# Patient Record
Sex: Female | Born: 1995 | Race: Black or African American | Hispanic: No | Marital: Single | Smoking: Current every day smoker
Health system: Southern US, Community
[De-identification: ages and names within clinical notes are randomized; demographics above are authoritative.]

## PROBLEM LIST (undated history)

## (undated) DIAGNOSIS — Z789 Other specified health status: Secondary | ICD-10-CM

## (undated) HISTORY — PX: NO PAST SURGERIES: SHX2092

---
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
## 2014-10-01 ENCOUNTER — Emergency Department: Payer: Self-pay | Admitting: Emergency Medicine

## 2017-03-29 ENCOUNTER — Emergency Department
Admission: EM | Admit: 2017-03-29 | Discharge: 2017-03-29 | Disposition: A | Discharge status: Home / Self Care | Payer: Self-pay | Attending: Emergency Medicine | Admitting: Emergency Medicine

## 2017-03-29 ENCOUNTER — Emergency Department: Payer: Self-pay

## 2017-03-29 ENCOUNTER — Encounter: Payer: Self-pay | Admitting: Emergency Medicine

## 2017-03-29 DIAGNOSIS — S93432A Sprain of tibiofibular ligament of left ankle, initial encounter: Secondary | ICD-10-CM | POA: Insufficient documentation

## 2017-03-29 DIAGNOSIS — Y929 Unspecified place or not applicable: Secondary | ICD-10-CM | POA: Insufficient documentation

## 2017-03-29 DIAGNOSIS — S93492A Sprain of other ligament of left ankle, initial encounter: Secondary | ICD-10-CM

## 2017-03-29 DIAGNOSIS — Y9302 Activity, running: Secondary | ICD-10-CM | POA: Insufficient documentation

## 2017-03-29 DIAGNOSIS — Y990 Civilian activity done for income or pay: Secondary | ICD-10-CM | POA: Insufficient documentation

## 2017-03-29 DIAGNOSIS — W1789XA Other fall from one level to another, initial encounter: Secondary | ICD-10-CM | POA: Insufficient documentation

## 2017-03-29 MED ORDER — IBUPROFEN 600 MG PO TABS: 600.0000 mg | ORAL_TABLET | Freq: Four times a day (QID) | ORAL | 0 refills | Status: DC | PRN

## 2017-03-29 NOTE — ED Provider Notes (Signed)
[facility]-EMERGENCY DEPARTMENT Provider Note   CSN: [id number] Arrival date & time: 03/29/17  2155     History   Chief Complaint Chief Complaint  Patient presents with  . Ankle Pain    HPI Courtney Forbes is a 21 y.o. female presents to the emergency department for evaluation of left ankle pain. 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 She was able to continue walking and ambulating but later on in the day was unable to bear weight on the left ankle. She complains of lateral ankle pain that is increased with weightbearing. She gets relief with staying off of the ankle. She has not had any medications for pain today. Her pain is moderate with weightbearing. Denies any other injury to her body.  HPI  No past medical history on file.  There are no active problems to display for this patient.   No past surgical history on file.  OB History    No data available       Home Medications    Prior to Admission medications   Medication Sig Start Date End Date Taking? Authorizing Provider  ibuprofen (ADVIL,MOTRIN) 600 MG tablet Take 1 tablet (600 mg total) by mouth every 6 (six) hours as needed for moderate pain. 03/29/17   Evon SlackGaines, Cohen Boettner C, PA-C    Family History No family history on file.  Social History Social History  Substance Use Topics  . Smoking status: Not on file  . Smokeless tobacco: Not on file  . Alcohol use Not on file     Allergies   Patient has no known allergies.   Review of Systems Review of Systems  Constitutional: Negative.  Negative for activity change, chills, fatigue and fever.  HENT: Negative for congestion, sinus pressure and sore throat.   Eyes: Negative for visual disturbance.  Respiratory: Negative for cough, chest tightness and shortness of breath.   Cardiovascular: Negative for chest pain and leg swelling.  Gastrointestinal: Negative for  abdominal pain, diarrhea, nausea and vomiting.  Genitourinary: Negative for dysuria.  Musculoskeletal: Positive for gait problem and joint swelling. Negative for arthralgias, back pain and neck pain.  Skin: Negative for color change, rash and wound.  Neurological: Negative for dizziness, syncope, weakness, numbness and headaches.  Hematological: Negative for adenopathy.  Psychiatric/Behavioral: Negative for agitation, behavioral problems, confusion and hallucinations.  All other systems reviewed and are negative.    Physical Exam Updated Vital Signs BP 131/85 (BP Location: Left Arm)   Pulse (!) 108   Temp 97.8 F (36.6 C)   Resp 18   Ht 5\' 7"  (1.702 m)   Wt 104.3 kg   LMP 02/27/2017   SpO2 100%   BMI 36.02 kg/m   Physical Exam  Constitutional: She is oriented to person, place, and time. She appears well-developed and well-nourished. No distress.  HENT:  Head: Normocephalic and atraumatic.  Mouth/Throat: Oropharynx is clear and moist.  Eyes: EOM are normal. Pupils are equal, round, and reactive to light. Right eye exhibits no discharge. Left eye exhibits no discharge.  Neck: Normal range of motion. Neck supple.  Cardiovascular: Normal rate, regular rhythm and intact distal pulses.   Pulmonary/Chest: Effort normal and breath sounds normal. No respiratory distress. She exhibits no tenderness.  Abdominal: Soft. She exhibits no distension. There is no tenderness.  Musculoskeletal: Normal range of motion. She exhibits no edema.  Left ankle: She exhibits swelling. She exhibits no ecchymosis, no deformity, no laceration and normal pulse. Tenderness. Lateral malleolus and AITFL tenderness found. Achilles tendon exhibits no pain, no defect and normal Thompson's test results.  Neurological: She is alert and oriented to person, place, and time. She has normal reflexes.  Skin: Skin is warm and dry.  Psychiatric: She has a normal mood and affect. Her behavior is normal. Judgment and  thought content normal.     ED Treatments / Results  Labs (all labs ordered are listed, but only abnormal results are displayed) Labs Reviewed - No data to display  EKG  EKG Interpretation None       Radiology Dg Ankle Complete Left  Result Date: 03/29/2017 CLINICAL DATA:  Twisting injury to left ankle while walking, with left ankle pain. Initial encounter. EXAM: LEFT ANKLE COMPLETE - 3+ VIEW COMPARISON:  None. FINDINGS: There is no evidence of fracture or dislocation. The ankle mortise is intact; the interosseous space is within normal limits. No talar tilt or subluxation is seen. A plantar calcaneal spur is noted. The joint spaces are preserved. No significant soft tissue abnormalities are seen. IMPRESSION: No evidence of fracture or dislocation. Electronically Signed   By: Roanna Raider M.D.   On: 03/29/2017 22:19    Procedures Procedures (including critical care time) SPLINT APPLICATION Date/Time: 11:13 PM Authorized by: Patience Musca Consent: Verbal consent obtained. Risks and benefits: risks, benefits and alternatives were discussed Consent given by: patient Splint applied by: ED tech Location details: Left ankle  Splint type: Prefabricated ankle stirrup splint  Supplies used: Prefabricated ankle stirrup splint, crutches Post-procedure: The splinted body part was neurovascularly unchanged following the procedure. Patient tolerance: Patient tolerated the procedure well with no immediate complications.     Medications Ordered in ED Medications - No data to display   Initial Impression / Assessment and Plan / ED Course  I have reviewed the triage vital signs and the nursing notes.  Pertinent labs & imaging results that were available during my care of the patient were reviewed by me and considered in my medical decision making (see chart for details).     21 year old female with left lateral ankle sprain. She will rest ice and elevate. She is given  crutches to help with ambulation. I bupropion as needed for pain. She will follow-up with orthopedics in 5-7 days if no improvement.  Final Clinical Impressions(s) / ED Diagnoses   Final diagnoses:  Sprain of anterior talofibular ligament of left ankle, initial encounter    New Prescriptions New Prescriptions   IBUPROFEN (ADVIL,MOTRIN) 600 MG TABLET    Take 1 tablet (600 mg total) by mouth every 6 (six) hours as needed for moderate pain.     Evon Slack, PA-C 03/29/17 2313    Emily Filbert, MD 03/30/17 1501

## 2017-03-29 NOTE — Discharge Instructions (Signed)
Please rest ice and elevate the left ankle. Use crutches as needed for ambulation. Ibuprofen as needed for pain. Follow-up with orthopedics if no improvement in 5-7 days.

## 2017-03-29 NOTE — ED Notes (Signed)

## 2017-03-29 NOTE — ED Triage Notes (Signed)
Pt twisted left ankle today, co pain.

## 2017-12-05 ENCOUNTER — Encounter: Payer: Self-pay | Admitting: Emergency Medicine

## 2017-12-05 ENCOUNTER — Emergency Department
Admission: EM | Admit: 2017-12-05 | Discharge: 2017-12-05 | Disposition: A | Discharge status: Home / Self Care | Payer: Self-pay | Attending: Emergency Medicine | Admitting: Emergency Medicine

## 2017-12-05 DIAGNOSIS — R112 Nausea with vomiting, unspecified: Secondary | ICD-10-CM | POA: Insufficient documentation

## 2017-12-05 DIAGNOSIS — R109 Unspecified abdominal pain: Secondary | ICD-10-CM | POA: Insufficient documentation

## 2017-12-05 DIAGNOSIS — R197 Diarrhea, unspecified: Secondary | ICD-10-CM | POA: Insufficient documentation

## 2017-12-05 LAB — CBC
HCT: 36.8 % (ref 35.0–47.0)
HEMOGLOBIN: 12.2 g/dL (ref 12.0–16.0)
MCH: 26.8 pg (ref 26.0–34.0)
MCHC: 33.2 g/dL (ref 32.0–36.0)
MCV: 80.8 fL (ref 80.0–100.0)
Platelets: 261 10*3/uL (ref 150–440)
RBC: 4.56 MIL/uL (ref 3.80–5.20)
RDW: 16.3 % — ABNORMAL HIGH (ref 11.5–14.5)
WBC: 4.8 10*3/uL (ref 3.6–11.0)

## 2017-12-05 LAB — URINALYSIS, COMPLETE (UACMP) WITH MICROSCOPIC
BILIRUBIN URINE: NEGATIVE
Bacteria, UA: NONE SEEN
GLUCOSE, UA: NEGATIVE mg/dL
Hgb urine dipstick: NEGATIVE
Ketones, ur: NEGATIVE mg/dL
LEUKOCYTES UA: NEGATIVE
Nitrite: NEGATIVE
PROTEIN: NEGATIVE mg/dL
Specific Gravity, Urine: 1.029 (ref 1.005–1.030)
pH: 5 (ref 5.0–8.0)

## 2017-12-05 LAB — COMPREHENSIVE METABOLIC PANEL
ALT: 28 U/L (ref 14–54)
ANION GAP: 7 (ref 5–15)
AST: 25 U/L (ref 15–41)
Albumin: 3.9 g/dL (ref 3.5–5.0)
Alkaline Phosphatase: 64 U/L (ref 38–126)
BUN: 17 mg/dL (ref 6–20)
CALCIUM: 9.1 mg/dL (ref 8.9–10.3)
CHLORIDE: 107 mmol/L (ref 101–111)
CO2: 22 mmol/L (ref 22–32)
Creatinine, Ser: 0.71 mg/dL (ref 0.44–1.00)
GFR calc non Af Amer: >60 mL/min (ref >60)
Glucose, Bld: 83 mg/dL (ref 65–99)
Potassium: 3.8 mmol/L (ref 3.5–5.1)
SODIUM: 136 mmol/L (ref 135–145)
Total Bilirubin: 0.6 mg/dL (ref 0.3–1.2)
Total Protein: 7.9 g/dL (ref 6.5–8.1)

## 2017-12-05 LAB — LIPASE, BLOOD: LIPASE: 28 U/L (ref 11–51)

## 2017-12-05 LAB — POCT PREGNANCY, URINE: Preg Test, Ur: NEGATIVE

## 2017-12-05 MED ORDER — ONDANSETRON HCL 4 MG PO TABS: 4.0000 mg | ORAL_TABLET | Freq: Every day | ORAL | 0 refills | Status: DC | PRN

## 2017-12-05 MED ORDER — DICYCLOMINE HCL 20 MG PO TABS: 20.0000 mg | ORAL_TABLET | Freq: Three times a day (TID) | ORAL | 0 refills | Status: DC | PRN

## 2017-12-05 NOTE — ED Triage Notes (Signed)
Pt reports for the last 3 days has been experiencing abdominal pain and diarrhea. Pt states, "I think my stomach pain is causing the diarrhea". Pt reports her stools have been "loose and hard to come out". Pt denies NV, fevers, dysuria or other symptoms. Pt reports pain is mid lower abdomen.

## 2017-12-05 NOTE — ED Provider Notes (Signed)
[facility], [facility] Emergency Department Provider Note  ____________________________________________   First MD Initiated Contact with Patient 12/05/17 1401     (approximate)  I have reviewed the triage vital signs and the nursing notes.   HISTORY  Chief Complaint Abdominal Pain and Diarrhea   HPI Courtney Forbes is a 22 y.o. female with a history of seasonal allergies who is presenting to the emergency department with 3 days of diarrhea, nausea as well as abdominal cramping.  She says that she has up to 6 episodes of diarrhea per day.  She denies any blood in the diarrhea.  Also one episode of vomiting this morning.  Last episode of vomiting or diarrhea 10 AM this morning.  Denies any abdominal pain at this time.  Says the abdominal pain is lower and of a cramping type and usually happens around the time where she needs to move her bowels.  She denies any recent out of country travel, hospital admissions or antibiotics.  She says that she is a known exposure to her roommate who had a similar illness recently where she was having diarrhea, nausea and vomiting.  Patient says that she continues to feel nauseous.  History reviewed. No pertinent past medical history.  There are no active problems to display for this patient.   History reviewed. No pertinent surgical history.  Prior to Admission medications   Medication Sig Start Date End Date Taking? Authorizing Provider  ibuprofen (ADVIL,MOTRIN) 600 MG tablet Take 1 tablet (600 mg total) by mouth every 6 (six) hours as needed for moderate pain. 03/29/17   Evon SlackGaines, Thomas C, PA-C    Allergies Patient has no known allergies.  No family history on file.  Social History Social History   Tobacco Use  . Smoking status: Not on file  Substance Use Topics  . Alcohol use: Not on file  . Drug use: Not on file    Review of Systems  Constitutional: No fever/chills Eyes: No visual changes. ENT: No sore  throat. Cardiovascular: Denies chest pain. Respiratory: Denies shortness of breath. Gastrointestinal:  No diarrhea.  No constipation. Genitourinary: Negative for dysuria. Musculoskeletal: Negative for back pain. Skin: Negative for rash. Neurological: Negative for headaches, focal weakness or numbness.   ____________________________________________   PHYSICAL EXAM:  VITAL SIGNS: ED Triage Vitals  Enc Vitals Group     BP 12/05/17 1043 (!) 120/103     Pulse Rate 12/05/17 1043 82     Resp 12/05/17 1043 18     Temp 12/05/17 1043 97.9 F (36.6 C)     Temp Source 12/05/17 1043 Oral     SpO2 12/05/17 1043 100 %     Weight 12/05/17 1110 250 lb (113.4 kg)     Height 12/05/17 1110 5\' 6"  (1.676 m)     Head Circumference --      Peak Flow --      Pain Score --      Pain Loc --      Pain Edu? --      Excl. in GC? --     Constitutional: Alert and oriented. Well appearing and in no acute distress. Eyes: Conjunctivae are normal.  Head: Atraumatic. Nose: No congestion/rhinnorhea. Mouth/Throat: Mucous membranes are moist.  Neck: No stridor.   Cardiovascular: Normal rate, regular rhythm. Grossly normal heart sounds.  Respiratory: Normal respiratory effort.  No retractions. Lungs CTAB. Gastrointestinal: Soft and nontender. No distention.  Musculoskeletal: No lower extremity tenderness nor edema.  No joint effusions. Neurologic:  Normal speech and language. No gross focal neurologic deficits are appreciated. Skin:  Skin is warm, dry and intact. No rash noted. Psychiatric: Mood and affect are normal. Speech and behavior are normal.  ____________________________________________   LABS (all labs ordered are listed, but only abnormal results are displayed)  Labs Reviewed  CBC - Abnormal; Notable for the following components:      Result Value   RDW 16.3 (*)    All other components within normal limits  URINALYSIS, COMPLETE (UACMP) WITH MICROSCOPIC - Abnormal; Notable for the  following components:   Color, Urine YELLOW (*)    APPearance HAZY (*)    Squamous Epithelial / LPF 0-5 (*)    All other components within normal limits  LIPASE, BLOOD  COMPREHENSIVE METABOLIC PANEL  POC URINE PREG, ED  POCT PREGNANCY, URINE   ____________________________________________  EKG   ____________________________________________  RADIOLOGY   ____________________________________________   PROCEDURES  Procedure(s) performed:   Procedures  Critical Care performed:   ____________________________________________   INITIAL IMPRESSION / ASSESSMENT AND PLAN / ED COURSE  Pertinent labs & imaging results that were available during my care of the patient were reviewed by me and considered in my medical decision making (see chart for details).  Differential diagnosis includes, but is not limited to, ovarian cyst, ovarian torsion, acute appendicitis, diverticulitis, urinary tract infection/pyelonephritis, endometriosis, bowel obstruction, colitis, renal colic, gastroenteritis, hernia, fibroids, endometriosis, pregnancy related pain including ectopic pregnancy, etc.  Differential diagnosis includes, but is not limited to, biliary disease (biliary colic, acute cholecystitis, cholangitis, choledocholithiasis, etc), intrathoracic causes for epigastric abdominal pain including ACS, gastritis, duodenitis, pancreatitis, small bowel or large bowel obstruction, abdominal aortic aneurysm, hernia, and gastritis. As part of my medical decision making, I reviewed the following data within the electronic MEDICAL RECORD NUMBER Notes from prior ED visits  Patient likely with viral illness.  Very benign exam as well as lab results.  I will discharge the patient with Zofran and Bentyl and advised her to drink plenty of fluids to stay hydrated.  I will give her primary care follow-up.  To return for any worsening or concerning symptoms.  Do not suspect severe intra-abdominal pathology that would  require surgical intervention due to the very benign abdominal exam as well as a normal white blood cell count and benign appearance.  Patient understanding of the plan and willing to comply.      ____________________________________________   FINAL CLINICAL IMPRESSION(S) / ED DIAGNOSES  Abdominal pain with nausea vomiting and diarrhea.    NEW MEDICATIONS STARTED DURING THIS VISIT:  New Prescriptions   No medications on file     Note:  This document was prepared using Dragon voice recognition software and may include unintentional dictation errors.     Myrna Blazer, MD 12/05/17 [id number]

## 2017-12-05 NOTE — ED Notes (Signed)
Unable to access E-Sig pad in room. Discharge instructions printed, signed and sent to HIM for scan into the record.

## 2017-12-05 NOTE — ED Notes (Signed)
Pt verbalizes understanding of d/c instructions, medications and follow up 

## 2018-01-25 ENCOUNTER — Other Ambulatory Visit: Payer: Self-pay

## 2018-01-25 ENCOUNTER — Emergency Department
Admission: EM | Admit: 2018-01-25 | Discharge: 2018-01-25 | Disposition: A | Discharge status: Home / Self Care | Payer: Self-pay | Attending: Emergency Medicine | Admitting: Emergency Medicine

## 2018-01-25 DIAGNOSIS — J40 Bronchitis, not specified as acute or chronic: Secondary | ICD-10-CM | POA: Insufficient documentation

## 2018-01-25 DIAGNOSIS — F17210 Nicotine dependence, cigarettes, uncomplicated: Secondary | ICD-10-CM | POA: Insufficient documentation

## 2018-01-25 MED ORDER — ALBUTEROL SULFATE HFA 108 (90 BASE) MCG/ACT IN AERS: 2.0000 | INHALATION_SPRAY | Freq: Four times a day (QID) | RESPIRATORY_TRACT | 0 refills | Status: AC | PRN

## 2018-01-25 MED ORDER — AZITHROMYCIN 250 MG PO TABS: ORAL_TABLET | ORAL | 0 refills | Status: DC

## 2018-01-25 MED ORDER — PREDNISONE 10 MG PO TABS: ORAL_TABLET | ORAL | 0 refills | Status: DC

## 2018-01-25 NOTE — ED Provider Notes (Signed)
[facility] Emergency Department Provider Note  ____________________________________________  Time seen: Approximately 11:07 AM  I have reviewed the triage vital signs and the nursing notes.   HISTORY  Chief Complaint Cough    HPI Courtney Forbes is a 22 y.o. female that presents to the emergency department for evaluation of productive cough with clear sputum for 1 week.  Patient states that she had a couple episodes of vomiting and diarrhea 5-6 days ago but these have resolved.  She had a fever of 102 earlier in the week.  She works at a daycare.  She smokes a pack of cigarettes per week.  No nasal congestion, shortness of breath, abdominal pain.   History reviewed. No pertinent past medical history.  There are no active problems to display for this patient.   History reviewed. No pertinent surgical history.  Prior to Admission medications   Medication Sig Start Date End Date Taking? Authorizing Provider  albuterol (PROVENTIL HFA;VENTOLIN HFA) 108 (90 Base) MCG/ACT inhaler Inhale 2 puffs into the lungs every 6 (six) hours as needed for wheezing or shortness of breath. 01/25/18   Enid Derry, PA-C  azithromycin (ZITHROMAX Z-PAK) 250 MG tablet Take 2 tablets (500 mg) on  Day 1,  followed by 1 tablet (250 mg) once daily on Days 2 through 5. 01/25/18   Enid Derry, PA-C  dicyclomine (BENTYL) 20 MG tablet Take 1 tablet (20 mg total) by mouth 3 (three) times daily as needed for spasms. 12/05/17 12/05/18  Myrna Blazer, MD  ibuprofen (ADVIL,MOTRIN) 600 MG tablet Take 1 tablet (600 mg total) by mouth every 6 (six) hours as needed for moderate pain. 03/29/17   Evon Slack, PA-C  ondansetron (ZOFRAN) 4 MG tablet Take 1 tablet (4 mg total) by mouth daily as needed. 12/05/17   Schaevitz, Myra Rude, MD  predniSONE (DELTASONE) 10 MG tablet Take 6 tablets on day 1, take 5 tablets on day 2, take 4 tablets on day 3, take 3 tablets on day 4, take 2 tablets on  day 5, take 1 tablet on day 6 01/25/18   Enid Derry, PA-C    Allergies Patient has no known allergies.  No family history on file.  Social History Social History   Tobacco Use  . Smoking status: Never Smoker  Substance Use Topics  . Alcohol use: No    Frequency: Never  . Drug use: Not on file     Review of Systems  Eyes: No visual changes. No discharge. ENT: Negative for congestion and rhinorrhea. Cardiovascular: No chest pain. Respiratory: Positive for cough. No SOB. Gastrointestinal: No abdominal pain.  No nausea.  No constipation. Musculoskeletal: Negative for musculoskeletal pain. Skin: Negative for rash, abrasions, lacerations, ecchymosis. Neurological: Negative for headaches.   ____________________________________________   PHYSICAL EXAM:  VITAL SIGNS: ED Triage Vitals [01/25/18 0957]  Enc Vitals Group     BP 102/77     Pulse Rate 99     Resp 18     Temp 98 F (36.7 C)     Temp Source Oral     SpO2 100 %     Weight 250 lb (113.4 kg)     Height      Head Circumference      Peak Flow      Pain Score 0     Pain Loc      Pain Edu?      Excl. in GC?      Constitutional: Alert and oriented. Well  appearing and in no acute distress. Eyes: Conjunctivae are normal. PERRL. EOMI. No discharge. Head: Atraumatic. ENT: No frontal and maxillary sinus tenderness.      Ears: Tympanic membranes pearly gray with good landmarks. No discharge.      Nose: No congestion/rhinnorhea.      Mouth/Throat: Mucous membranes are moist. Oropharynx non-erythematous. Tonsils not enlarged. No exudates. Uvula midline. Neck: No stridor.   Hematological/Lymphatic/Immunilogical: No cervical lymphadenopathy. Cardiovascular: Normal rate, regular rhythm.  Good peripheral circulation. Respiratory: Normal respiratory effort without tachypnea or retractions. Lungs CTAB. Good air entry to the bases with no decreased or absent breath sounds. Gastrointestinal: Bowel sounds 4 quadrants.  Soft and nontender to palpation. No guarding or rigidity. No palpable masses. No distention. Musculoskeletal: Full range of motion to all extremities. No gross deformities appreciated. Neurologic:  Normal speech and language. No gross focal neurologic deficits are appreciated.  Skin:  Skin is warm, dry and intact. No rash noted.   ____________________________________________   LABS (all labs ordered are listed, but only abnormal results are displayed)  Labs Reviewed - No data to display ____________________________________________  EKG   ____________________________________________  RADIOLOGY   No results found.  ____________________________________________    PROCEDURES  Procedure(s) performed:    Procedures    Medications - No data to display   ____________________________________________   INITIAL IMPRESSION / ASSESSMENT AND PLAN / ED COURSE  Pertinent labs & imaging results that were available during my care of the patient were reviewed by me and considered in my medical decision making (see chart for details).  Review of the [place] CSRS was performed in accordance of the [facility] prior to dispensing any controlled drugs.     Patient's diagnosis is consistent with bronchitis. Vital signs and exam are reassuring. Patient appears well and is staying well hydrated. Patient feels comfortable going home. Patient will be discharged home with prescriptions for azithromycin, prednisone, albuterol inhaler. Patient is to follow up with PCP as needed or otherwise directed. Patient is given ED precautions to return to the ED for any worsening or new symptoms.     ____________________________________________  FINAL CLINICAL IMPRESSION(S) / ED DIAGNOSES  Final diagnoses:  Bronchitis      NEW MEDICATIONS STARTED DURING THIS VISIT:  ED Discharge Orders        Ordered    azithromycin (ZITHROMAX Z-PAK) 250 MG tablet     01/25/18 1128    albuterol (PROVENTIL  HFA;VENTOLIN HFA) 108 (90 Base) MCG/ACT inhaler  Every 6 hours PRN     01/25/18 1128    predniSONE (DELTASONE) 10 MG tablet     01/25/18 1128          This chart was dictated using voice recognition software/Dragon. Despite best efforts to proofread, errors can occur which can change the meaning. Any change was purely unintentional.    Enid DerryWagner, Jame Seelig, PA-C 01/25/18 1608    Phineas SemenGoodman, Graydon, MD 01/26/18 [id number]

## 2018-01-25 NOTE — ED Triage Notes (Signed)
Chest congestion and cough X 3 days, yellow production. Pt alert and oriented X4, active, cooperative, pt in NAD. RR even and unlabored, color WNL.

## 2018-05-14 ENCOUNTER — Emergency Department
Admission: EM | Admit: 2018-05-14 | Discharge: 2018-05-14 | Disposition: A | Discharge status: Home / Self Care | Payer: Self-pay | Attending: Emergency Medicine | Admitting: Emergency Medicine

## 2018-05-14 ENCOUNTER — Encounter: Payer: Self-pay | Admitting: Emergency Medicine

## 2018-05-14 DIAGNOSIS — H1032 Unspecified acute conjunctivitis, left eye: Secondary | ICD-10-CM | POA: Insufficient documentation

## 2018-05-14 MED ORDER — POLYMYXIN B-TRIMETHOPRIM 10000-0.1 UNIT/ML-% OP SOLN: 2.0000 [drp] | Freq: Four times a day (QID) | OPHTHALMIC | 0 refills | Status: DC

## 2018-05-14 MED ORDER — POLYMYXIN B-TRIMETHOPRIM 10000-0.1 UNIT/ML-% OP SOLN
1.0000 [drp] | OPHTHALMIC | Status: DC
  Administered 2018-05-14: 1 [drp] via OPHTHALMIC
  Filled 2018-05-14: qty 10

## 2018-05-14 MED ORDER — AZELASTINE HCL 0.05 % OP SOLN: 1.0000 [drp] | Freq: Two times a day (BID) | OPHTHALMIC | 1 refills | Status: DC

## 2018-05-14 NOTE — ED Provider Notes (Signed)
[facility] Emergency Department Provider Note  ____________________________________________  Time seen: Approximately 10:18 PM  I have reviewed the triage vital signs and the nursing notes.   HISTORY  Chief Complaint Eye Pain    HPI Courtney Forbes is a 22 y.o. female who presents the emergency department complaining of left eye redness, itching, drainage.  Patient reports that yesterday she started to have "itching" to the left eye.  She was rubbing at it and it became "inflamed."  Patient reports that today she has had some yellow/green drainage from the left eye.  Patient does work around [employer] but she states that none of her children have been diagnosed with pinkeye.  Patient reports that symptoms are most consistent with "itching".  She does not wear glasses or contacts.  No trauma to the eye.  No other complaints at this time.    History reviewed. No pertinent past medical history.  There are no active problems to display for this patient.   History reviewed. No pertinent surgical history.  Prior to Admission medications   Medication Sig Start Date End Date Taking? Authorizing Provider  albuterol (PROVENTIL HFA;VENTOLIN HFA) 108 (90 Base) MCG/ACT inhaler Inhale 2 puffs into the lungs every 6 (six) hours as needed for wheezing or shortness of breath. 01/25/18   Enid Derry, PA-C  azelastine (OPTIVAR) 0.05 % ophthalmic solution Place 1 drop into the left eye 2 (two) times daily. 05/14/18   Maley Venezia, Delorise Royals, PA-C  azithromycin (ZITHROMAX Z-PAK) 250 MG tablet Take 2 tablets (500 mg) on  Day 1,  followed by 1 tablet (250 mg) once daily on Days 2 through 5. 01/25/18   Enid Derry, PA-C  dicyclomine (BENTYL) 20 MG tablet Take 1 tablet (20 mg total) by mouth 3 (three) times daily as needed for spasms. 12/05/17 12/05/18  Myrna Blazer, MD  ibuprofen (ADVIL,MOTRIN) 600 MG tablet Take 1 tablet (600 mg total) by mouth every 6 (six) hours as needed for  moderate pain. 03/29/17   Evon Slack, PA-C  ondansetron (ZOFRAN) 4 MG tablet Take 1 tablet (4 mg total) by mouth daily as needed. 12/05/17   Schaevitz, Myra Rude, MD  predniSONE (DELTASONE) 10 MG tablet Take 6 tablets on day 1, take 5 tablets on day 2, take 4 tablets on day 3, take 3 tablets on day 4, take 2 tablets on day 5, take 1 tablet on day 6 01/25/18   Enid Derry, PA-C  trimethoprim-polymyxin b (POLYTRIM) ophthalmic solution Place 2 drops into the left eye every 6 (six) hours. 05/14/18   Abdulmalik Darco, Delorise Royals, PA-C    Allergies Patient has no known allergies.  History reviewed. No pertinent family history.  Social History Social History   Tobacco Use  . Smoking status: Never Smoker  . Smokeless tobacco: Never Used  Substance Use Topics  . Alcohol use: No    Frequency: Never  . Drug use: Not on file     Review of Systems  Constitutional: No fever/chills Eyes: No visual changes.  Positive for redness and discharge from the left eye. ENT: No upper respiratory complaints. Cardiovascular: no chest pain. Respiratory: no cough. No SOB. Gastrointestinal: No abdominal pain.  No nausea, no vomiting.  Musculoskeletal: Negative for musculoskeletal pain. Skin: Negative for rash, abrasions, lacerations, ecchymosis. Neurological: Negative for headaches, focal weakness or numbness. 10-point ROS otherwise negative.  ____________________________________________   PHYSICAL EXAM:  VITAL SIGNS: ED Triage Vitals [05/14/18 2112]  Enc Vitals Group     BP 128/75  Pulse Rate (!) 115     Resp 19     Temp 98.2 F (36.8 C)     Temp Source Oral     SpO2 100 %     Weight      Height      Head Circumference      Peak Flow      Pain Score      Pain Loc      Pain Edu?      Excl. in GC?      Constitutional: Alert and oriented. Well appearing and in no acute distress. Eyes: Conjunctiva on the right is unremarkable, conjunctive on the left is erythematous.  No drainage is  appreciated on the upper or lower eyelashes.  No visible foreign body.Marland Kitchen PERRL. EOMI. funduscopic exam is unremarkable bilaterally with red reflex, vasculature and optic disc visualized. Head: Atraumatic. ENT:      Ears:       Nose: No congestion/rhinnorhea.      Mouth/Throat: Mucous membranes are moist.  Neck: No stridor.    Cardiovascular: Normal rate, regular rhythm. Normal S1 and S2.  Good peripheral circulation. Respiratory: Normal respiratory effort without tachypnea or retractions. Lungs CTAB. Good air entry to the bases with no decreased or absent breath sounds. Musculoskeletal: Full range of motion to all extremities. No gross deformities appreciated. Neurologic:  Normal speech and language. No gross focal neurologic deficits are appreciated.  Skin:  Skin is warm, dry and intact. No rash noted. Psychiatric: Mood and affect are normal. Speech and behavior are normal. Patient exhibits appropriate insight and judgement.   ____________________________________________   LABS (all labs ordered are listed, but only abnormal results are displayed)  Labs Reviewed - No data to display ____________________________________________  EKG   ____________________________________________  RADIOLOGY   No results found.  ____________________________________________    PROCEDURES  Procedure(s) performed:    Procedures    Medications  trimethoprim-polymyxin b (POLYTRIM) ophthalmic solution 1 drop (has no administration in time range)     ____________________________________________   INITIAL IMPRESSION / ASSESSMENT AND PLAN / ED COURSE  Pertinent labs & imaging results that were available during my care of the patient were reviewed by me and considered in my medical decision making (see chart for details).  Review of the [place] CSRS was performed in accordance of the [facility] prior to dispensing any controlled drugs.      Patient's diagnosis is consistent with conjunctivitis.   Patient presents the emergency department with left eye redness, drainage.  Patient reports that symptoms began like "allergies" but her eye has worsened today.  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  Patient has had some purulent drainage but it is not been consistent throughout the day.  No visual changes.  Diagnosis is consistent with allergic conjunctivitis versus bacterial conjunctivitis.  At this time, patient will be treated for both with Optivar and Polytrim..  Patient will follow-up with primary care as needed.  Patient is given ED precautions to return to the ED for any worsening or new symptoms.     ____________________________________________  FINAL CLINICAL IMPRESSION(S) / ED DIAGNOSES  Final diagnoses:  Acute conjunctivitis of left eye, unspecified acute conjunctivitis type      NEW MEDICATIONS STARTED DURING THIS VISIT:  ED Discharge Orders        Ordered    azelastine (OPTIVAR) 0.05 % ophthalmic solution  2 times daily     05/14/18 2221  trimethoprim-polymyxin b (POLYTRIM) ophthalmic solution  Every 6 hours     05/14/18 2222          This chart was dictated using voice recognition software/Dragon. Despite best efforts to proofread, errors can occur which can change the meaning. Any change was purely unintentional.    Racheal PatchesCuthriell, Glennice Marcos D, PA-C 05/14/18 2222    Jeanmarie PlantMcShane, James A, MD 05/14/18 2352

## 2018-05-14 NOTE — ED Triage Notes (Signed)
Pt c/o left eye pain, redness and green discharge x2 days. Pt reports symptoms worse in AM.

## 2018-10-08 ENCOUNTER — Emergency Department: Payer: Medicaid Other

## 2018-10-08 ENCOUNTER — Encounter: Payer: Self-pay | Admitting: Emergency Medicine

## 2018-10-08 ENCOUNTER — Other Ambulatory Visit: Payer: Self-pay

## 2018-10-08 ENCOUNTER — Emergency Department
Admission: EM | Admit: 2018-10-08 | Discharge: 2018-10-08 | Disposition: A | Discharge status: Home / Self Care | Payer: Medicaid Other | Attending: Emergency Medicine | Admitting: Emergency Medicine

## 2018-10-08 DIAGNOSIS — Z3A09 9 weeks gestation of pregnancy: Secondary | ICD-10-CM | POA: Insufficient documentation

## 2018-10-08 DIAGNOSIS — O9989 Other specified diseases and conditions complicating pregnancy, childbirth and the puerperium: Secondary | ICD-10-CM | POA: Diagnosis not present

## 2018-10-08 DIAGNOSIS — Z79899 Other long term (current) drug therapy: Secondary | ICD-10-CM | POA: Diagnosis not present

## 2018-10-08 DIAGNOSIS — R1030 Lower abdominal pain, unspecified: Secondary | ICD-10-CM | POA: Diagnosis present

## 2018-10-08 DIAGNOSIS — R109 Unspecified abdominal pain: Secondary | ICD-10-CM

## 2018-10-08 DIAGNOSIS — O26891 Other specified pregnancy related conditions, first trimester: Secondary | ICD-10-CM

## 2018-10-08 LAB — URINALYSIS, COMPLETE (UACMP) WITH MICROSCOPIC
BILIRUBIN URINE: NEGATIVE
Glucose, UA: NEGATIVE mg/dL
Ketones, ur: NEGATIVE mg/dL
NITRITE: NEGATIVE
Protein, ur: NEGATIVE mg/dL
Specific Gravity, Urine: 1.02 (ref 1.005–1.030)
pH: 5 (ref 5.0–8.0)

## 2018-10-08 LAB — CBC
HEMATOCRIT: 36 % (ref 36.0–46.0)
HEMOGLOBIN: 11.6 g/dL — AB (ref 12.0–15.0)
MCH: 26.4 pg (ref 26.0–34.0)
MCHC: 32.2 g/dL (ref 30.0–36.0)
MCV: 81.8 fL (ref 80.0–100.0)
NRBC: 0 % (ref 0.0–0.2)
Platelets: 239 10*3/uL (ref 150–400)
RBC: 4.4 MIL/uL (ref 3.87–5.11)
RDW: 16.9 % — ABNORMAL HIGH (ref 11.5–15.5)
WBC: 8 10*3/uL (ref 4.0–10.5)

## 2018-10-08 LAB — COMPREHENSIVE METABOLIC PANEL
ALT: 68 U/L — ABNORMAL HIGH (ref 0–44)
ANION GAP: 9 (ref 5–15)
AST: 44 U/L — ABNORMAL HIGH (ref 15–41)
Albumin: 4 g/dL (ref 3.5–5.0)
Alkaline Phosphatase: 72 U/L (ref 38–126)
BUN: 8 mg/dL (ref 6–20)
CHLORIDE: 105 mmol/L (ref 98–111)
CO2: 23 mmol/L (ref 22–32)
Calcium: 9.2 mg/dL (ref 8.9–10.3)
Creatinine, Ser: 0.56 mg/dL (ref 0.44–1.00)
GFR calc non Af Amer: >60 mL/min (ref >60)
Glucose, Bld: 84 mg/dL (ref 70–99)
POTASSIUM: 3.8 mmol/L (ref 3.5–5.1)
Sodium: 137 mmol/L (ref 135–145)
Total Bilirubin: 0.3 mg/dL (ref 0.3–1.2)
Total Protein: 7.9 g/dL (ref 6.5–8.1)

## 2018-10-08 LAB — HCG, QUANTITATIVE, PREGNANCY: HCG, BETA CHAIN, QUANT, S: 91696 m[IU]/mL — AB (ref <5)

## 2018-10-08 MED ORDER — ACETAMINOPHEN 500 MG PO TABS
1000.0000 mg | ORAL_TABLET | Freq: Once | ORAL | Status: AC
  Administered 2018-10-08: 1000 mg via ORAL
  Filled 2018-10-08: qty 2

## 2018-10-08 NOTE — ED Triage Notes (Signed)
Pt presents to ED with sharp lower abd pain since yesterday. Denies vaginal bleeding. approx [redacted] weeks pregnant. First obgyn appt is dec 9. +nausea

## 2018-10-08 NOTE — ED Provider Notes (Signed)
[facility] Emergency Department Provider Note  ____________________________________________  Time seen: Approximately 8:22 PM  I have reviewed the triage vital signs and the nursing notes.   HISTORY  Chief Complaint Abdominal Pain   HPI Courtney Forbes is a 22 y.o. female no significant past medical history who presents for evaluation of abdominal pain.  Patient reports that she took a pregnancy test a week ago and it was positive.  She is not sure how far long she is a since that she got pregnant on the Depakote shot.  She has not had menstrual periods for several months.  This is her first pregnancy.  Since yesterday she has been having intermittent mild sharp abdominal pain.  No pain at this time.  No vaginal discharge or vaginal bleeding.  This is patient's first pregnancy.  No nausea, vomiting, fever, chills, dysuria, diarrhea, constipation.  No prior abdominal surgeries.  Patient has not been seen for this pregnancy.  She has an appointment with OB/GYN in 20 days.  PMH None - reviewed  Prior to Admission medications   Medication Sig Start Date End Date Taking? Authorizing Provider  albuterol (PROVENTIL HFA;VENTOLIN HFA) 108 (90 Base) MCG/ACT inhaler Inhale 2 puffs into the lungs every 6 (six) hours as needed for wheezing or shortness of breath. 01/25/18   Enid DerryWagner, Ashley, PA-C  azelastine (OPTIVAR) 0.05 % ophthalmic solution Place 1 drop into the left eye 2 (two) times daily. 05/14/18   Cuthriell, Delorise RoyalsJonathan D, PA-C  azithromycin (ZITHROMAX Z-PAK) 250 MG tablet Take 2 tablets (500 mg) on  Day 1,  followed by 1 tablet (250 mg) once daily on Days 2 through 5. 01/25/18   Enid DerryWagner, Ashley, PA-C  dicyclomine (BENTYL) 20 MG tablet Take 1 tablet (20 mg total) by mouth 3 (three) times daily as needed for spasms. 12/05/17 12/05/18  Myrna BlazerSchaevitz, David Matthew, MD  ibuprofen (ADVIL,MOTRIN) 600 MG tablet Take 1 tablet (600 mg total) by mouth every 6 (six) hours as needed for  moderate pain. 03/29/17   Evon SlackGaines, Thomas C, PA-C  ondansetron (ZOFRAN) 4 MG tablet Take 1 tablet (4 mg total) by mouth daily as needed. 12/05/17   Schaevitz, Myra Rudeavid Matthew, MD  predniSONE (DELTASONE) 10 MG tablet Take 6 tablets on day 1, take 5 tablets on day 2, take 4 tablets on day 3, take 3 tablets on day 4, take 2 tablets on day 5, take 1 tablet on day 6 01/25/18   Enid DerryWagner, Ashley, PA-C  trimethoprim-polymyxin b (POLYTRIM) ophthalmic solution Place 2 drops into the left eye every 6 (six) hours. 05/14/18   Cuthriell, Delorise RoyalsJonathan D, PA-C    Allergies Patient has no known allergies.  No family history on file.  Social History Social History   Tobacco Use  . Smoking status: Never Smoker  . Smokeless tobacco: Never Used  Substance Use Topics  . Alcohol use: No    Frequency: Never  . Drug use: Not on file    Review of Systems  Constitutional: Negative for fever. Eyes: Negative for visual changes. ENT: Negative for sore throat. Neck: No neck pain  Cardiovascular: Negative for chest pain. Respiratory: Negative for shortness of breath. Gastrointestinal: + lower abdominal pain. No vomiting or diarrhea. Genitourinary: Negative for dysuria. Musculoskeletal: Negative for back pain. Skin: Negative for rash. Neurological: Negative for headaches, weakness or numbness. Psych: No SI or HI  ____________________________________________   PHYSICAL EXAM:  VITAL SIGNS: ED Triage Vitals  Enc Vitals Group     BP 10/08/18 1905 Marland Kitchen(!)  111/48     Pulse Rate 10/08/18 1905 (!) 102     Resp 10/08/18 1905 18     Temp 10/08/18 1905 97.7 F (36.5 C)     Temp Source 10/08/18 1905 Oral     SpO2 10/08/18 1905 100 %     Weight 10/08/18 1906 (!) 315 lb (142.9 kg)     Height 10/08/18 1906 5\' 4"  (1.626 m)     Head Circumference --      Peak Flow --      Pain Score 10/08/18 1906 7     Pain Loc --      Pain Edu? --      Excl. in GC? --     Constitutional: Alert and oriented. Well appearing and in no  apparent distress. HEENT:      Head: Normocephalic and atraumatic.         Eyes: Conjunctivae are normal. Sclera is non-icteric.       Mouth/Throat: Mucous membranes are moist.       Neck: Supple with no signs of meningismus. Cardiovascular: Regular rate and rhythm. No murmurs, gallops, or rubs. 2+ symmetrical distal pulses are present in all extremities. No JVD. Respiratory: Normal respiratory effort. Lungs are clear to auscultation bilaterally. No wheezes, crackles, or rhonchi.  Gastrointestinal: Obese, soft, non tender, and non distended with positive bowel sounds. No rebound or guarding. Musculoskeletal: Nontender with normal range of motion in all extremities. No edema, cyanosis, or erythema of extremities. Neurologic: Normal speech and language. Face is symmetric. Moving all extremities. No gross focal neurologic deficits are appreciated. Skin: Skin is warm, dry and intact. No rash noted. Psychiatric: Mood and affect are normal. Speech and behavior are normal.  ____________________________________________   LABS (all labs ordered are listed, but only abnormal results are displayed)  Labs Reviewed  COMPREHENSIVE METABOLIC PANEL - Abnormal; Notable for the following components:      Result Value   AST 44 (*)    ALT 68 (*)    All other components within normal limits  CBC - Abnormal; Notable for the following components:   Hemoglobin 11.6 (*)    RDW 16.9 (*)    All other components within normal limits  URINALYSIS, COMPLETE (UACMP) WITH MICROSCOPIC - Abnormal; Notable for the following components:   Color, Urine YELLOW (*)    APPearance CLEAR (*)    Hgb urine dipstick SMALL (*)    Leukocytes, UA TRACE (*)    Bacteria, UA RARE (*)    All other components within normal limits  HCG, QUANTITATIVE, PREGNANCY - Abnormal; Notable for the following components:   hCG, Beta Chain, Quant, S 91,696 (*)    All other components within normal limits  URINE CULTURE    ____________________________________________  EKG  none  ____________________________________________  RADIOLOGY  I have personally reviewed the images performed during this visit and I agree with the Radiologist's read.   Interpretation by Radiologist:  US Ob Comp Less 14 Wks  Result Date: 10/08/2018 CLINICAL DATA:  Lower abdominal pain. Assess for ectopic pregnancy. Unknown last menstrual. Beta hCG 91,696. EXAM: OBSTETRIC <14 WK [place] AND TRANSVAGINAL OB US TECHNIQUE: Both transabdominal and transvaginal ultrasound examinations were performed for complete evaluation of the gestation as well as the maternal uterus, adnexal regions, and pelvic cul-de-sac. Transvaginal technique was performed to assess early pregnancy. COMPARISON:  None. FINDINGS: Limited assessment due to large body habitus. Intrauterine gestational sac: Present. Yolk sac:  Present. Embryo:  Present. Cardiac Activity: Present. Heart Rate:  160 bpm CRL:  23.6 mm   9 w   0 d                  [place] [facility]: June 23rd 2020 Subchorionic hemorrhage:  None visualized. Maternal uterus/adnexae: LEFT ovary not sonographically identified fide. 3 mm anechoic cyst RIGHT ovary. No free fluid. IMPRESSION: 1. Habitus limited examination. LEFT ovary not sonographically identified. 2. Single live intrauterine pregnancy, gestational age by ultrasound 9 weeks and 0 days. No immediate complication. Electronically Signed   By: Awilda Metro M.D.   On: 10/08/2018 21:39   US Ob Transvaginal  Result Date: 10/08/2018 CLINICAL DATA:  Lower abdominal pain. Assess for ectopic pregnancy. Unknown last menstrual. Beta hCG 91,696. EXAM: OBSTETRIC <14 WK [place] AND TRANSVAGINAL OB US TECHNIQUE: Both transabdominal and transvaginal ultrasound examinations were performed for complete evaluation of the gestation as well as the maternal uterus, adnexal regions, and pelvic cul-de-sac. Transvaginal technique was performed to assess early pregnancy. COMPARISON:  None.  FINDINGS: Limited assessment due to large body habitus. Intrauterine gestational sac: Present. Yolk sac:  Present. Embryo:  Present. Cardiac Activity: Present. Heart Rate: 160 bpm CRL:  23.6 mm   9 w   0 d                  [place] [facility]: June 23rd 2020 Subchorionic hemorrhage:  None visualized. Maternal uterus/adnexae: LEFT ovary not sonographically identified fide. 3 mm anechoic cyst RIGHT ovary. No free fluid. IMPRESSION: 1. Habitus limited examination. LEFT ovary not sonographically identified. 2. Single live intrauterine pregnancy, gestational age by ultrasound 9 weeks and 0 days. No immediate complication. Electronically Signed   By: Awilda Metro M.D.   On: 10/08/2018 21:39      ____________________________________________   PROCEDURES  Procedure(s) performed: None Procedures Critical Care performed:  None ____________________________________________   INITIAL IMPRESSION / ASSESSMENT AND PLAN / ED COURSE   22 y.o. female no significant past medical history who presents for evaluation of intermittent suprapubic abdominal pain since yesterday. Positive pregnancy test at home last week, unknown how far she is. Abdomen is obese and non tender. Ddx ectopic, pregnancy, uti, std. No RLQ tenderness to palpation. UA negative for UTI. CBC WNL. CMP with mildly elevated LFTs but otherwise WNL. Recommended STD testing but patient refused and wishes to have it done together with pap smear in 20 days at [facility]. I explained that having an untreated STD may result in miscarriage. Patient understand that but continues to refuse testing. Will send for TVUS.   Clinical Course as of Oct 08 2156  Tue Oct 08, 2018  2145 Ultrasound confirms a living IUP measuring [redacted] weeks gestational age.  No other acute findings.  Patient remains extremely well-appearing with no tenderness on abdominal exam.  Recommended prenatal vitamins, she already has an appointment with OB/GYN in 20 days for prenatal care.  Discussed return  precautions.   [CV]    Clinical Course User Index [CV] Don Perking Washington, MD     As part of my medical decision making, I reviewed the following data within the electronic MEDICAL RECORD NUMBER Nursing notes reviewed and incorporated, Labs reviewed , Old chart reviewed, Radiograph reviewed , Notes from prior ED visits and [place] Controlled Substance Database    Pertinent labs & imaging results that were available during my care of the patient were reviewed by me and considered in my medical decision making (see chart for details).    ____________________________________________   FINAL CLINICAL IMPRESSION(S) / ED  DIAGNOSES  Final diagnoses:  Abdominal pain during pregnancy in first trimester      NEW MEDICATIONS STARTED DURING THIS VISIT:  ED Discharge Orders    None       Note:  This document was prepared using Dragon voice recognition software and may include unintentional dictation errors.    Nita Sickle, MD 10/08/18 2157

## 2018-10-08 NOTE — ED Notes (Signed)
Pt denies any vaginal bleeding or discharge at this time

## 2018-10-10 LAB — URINE CULTURE

## 2018-10-24 ENCOUNTER — Emergency Department
Admission: EM | Admit: 2018-10-24 | Discharge: 2018-10-25 | Disposition: A | Discharge status: Home / Self Care | Payer: Medicaid Other | Attending: Emergency Medicine | Admitting: Emergency Medicine

## 2018-10-24 DIAGNOSIS — Z3A11 11 weeks gestation of pregnancy: Secondary | ICD-10-CM | POA: Diagnosis not present

## 2018-10-24 DIAGNOSIS — Z3201 Encounter for pregnancy test, result positive: Secondary | ICD-10-CM | POA: Insufficient documentation

## 2018-10-24 DIAGNOSIS — H60501 Unspecified acute noninfective otitis externa, right ear: Secondary | ICD-10-CM

## 2018-10-24 DIAGNOSIS — H9201 Otalgia, right ear: Secondary | ICD-10-CM | POA: Diagnosis not present

## 2018-10-24 DIAGNOSIS — O9989 Other specified diseases and conditions complicating pregnancy, childbirth and the puerperium: Secondary | ICD-10-CM | POA: Diagnosis not present

## 2018-10-24 DIAGNOSIS — H6691 Otitis media, unspecified, right ear: Secondary | ICD-10-CM

## 2018-10-24 LAB — BASIC METABOLIC PANEL
Anion gap: 10 (ref 5–15)
BUN: 13 mg/dL (ref 6–20)
CO2: 23 mmol/L (ref 22–32)
Calcium: 9.5 mg/dL (ref 8.9–10.3)
Chloride: 105 mmol/L (ref 98–111)
Creatinine, Ser: 0.7 mg/dL (ref 0.44–1.00)
GFR calc non Af Amer: >60 mL/min (ref >60)
Glucose, Bld: 120 mg/dL — ABNORMAL HIGH (ref 70–99)
Potassium: 3.7 mmol/L (ref 3.5–5.1)
SODIUM: 138 mmol/L (ref 135–145)

## 2018-10-24 LAB — CBC WITH DIFFERENTIAL/PLATELET
Abs Immature Granulocytes: 0.04 10*3/uL (ref 0.00–0.07)
Basophils Absolute: 0 10*3/uL (ref 0.0–0.1)
Basophils Relative: 0 %
Eosinophils Absolute: 0.4 10*3/uL (ref 0.0–0.5)
Eosinophils Relative: 4 %
HCT: 36.6 % (ref 36.0–46.0)
Hemoglobin: 11.5 g/dL — ABNORMAL LOW (ref 12.0–15.0)
Immature Granulocytes: 1 %
LYMPHS PCT: 20 %
Lymphs Abs: 1.7 10*3/uL (ref 0.7–4.0)
MCH: 26 pg (ref 26.0–34.0)
MCHC: 31.4 g/dL (ref 30.0–36.0)
MCV: 82.8 fL (ref 80.0–100.0)
Monocytes Absolute: 0.5 10*3/uL (ref 0.1–1.0)
Monocytes Relative: 5 %
Neutro Abs: 5.8 10*3/uL (ref 1.7–7.7)
Neutrophils Relative %: 70 %
Platelets: 252 10*3/uL (ref 150–400)
RBC: 4.42 MIL/uL (ref 3.87–5.11)
RDW: 16.7 % — ABNORMAL HIGH (ref 11.5–15.5)
WBC: 8.4 10*3/uL (ref 4.0–10.5)
nRBC: 0 % (ref 0.0–0.2)

## 2018-10-24 LAB — HCG, QUANTITATIVE, PREGNANCY: hCG, Beta Chain, Quant, S: 83800 m[IU]/mL — ABNORMAL HIGH (ref <5)

## 2018-10-24 LAB — POCT PREGNANCY, URINE: Preg Test, Ur: POSITIVE — AB

## 2018-10-24 NOTE — ED Provider Notes (Signed)
[facility] Emergency Department Provider Note  Time seen: 11:49 PM  I have reviewed the triage vital signs and the nursing notes.   HISTORY  Chief Complaint Otalgia and Pelvic Pain    HPI Courtney Forbes is a 22 y.o. female approximately [redacted] weeks pregnant who presents to the emergency department for right ear pain x1 week.  Patient states for the past 1 week her ear has been hurting, has noticed some scabbing around the entrance to her ear.  Also states she is [redacted] weeks pregnant, and review of systems describes some pelvic pressure but states this has been present ever since she became pregnant.  Has OB follow-up soon.  Denies any sore throat, states mild congestion.  No fever.   History reviewed. No pertinent past medical history.  There are no active problems to display for this patient.   History reviewed. No pertinent surgical history.  Prior to Admission medications   Medication Sig Start Date End Date Taking? Authorizing Provider  albuterol (PROVENTIL HFA;VENTOLIN HFA) 108 (90 Base) MCG/ACT inhaler Inhale 2 puffs into the lungs every 6 (six) hours as needed for wheezing or shortness of breath. 01/25/18   Enid DerryWagner, Ashley, PA-C  azelastine (OPTIVAR) 0.05 % ophthalmic solution Place 1 drop into the left eye 2 (two) times daily. 05/14/18   Cuthriell, Delorise RoyalsJonathan D, PA-C  azithromycin (ZITHROMAX Z-PAK) 250 MG tablet Take 2 tablets (500 mg) on  Day 1,  followed by 1 tablet (250 mg) once daily on Days 2 through 5. 01/25/18   Enid DerryWagner, Ashley, PA-C  dicyclomine (BENTYL) 20 MG tablet Take 1 tablet (20 mg total) by mouth 3 (three) times daily as needed for spasms. 12/05/17 12/05/18  Myrna BlazerSchaevitz, David Matthew, MD  ibuprofen (ADVIL,MOTRIN) 600 MG tablet Take 1 tablet (600 mg total) by mouth every 6 (six) hours as needed for moderate pain. 03/29/17   Evon SlackGaines, Thomas C, PA-C  ondansetron (ZOFRAN) 4 MG tablet Take 1 tablet (4 mg total) by mouth daily as needed. 12/05/17   Schaevitz,  Myra Rudeavid Matthew, MD  predniSONE (DELTASONE) 10 MG tablet Take 6 tablets on day 1, take 5 tablets on day 2, take 4 tablets on day 3, take 3 tablets on day 4, take 2 tablets on day 5, take 1 tablet on day 6 01/25/18   Enid DerryWagner, Ashley, PA-C  trimethoprim-polymyxin b (POLYTRIM) ophthalmic solution Place 2 drops into the left eye every 6 (six) hours. 05/14/18   Cuthriell, Delorise RoyalsJonathan D, PA-C    No Known Allergies  No family history on file.  Social History Social History   Tobacco Use  . Smoking status: Never Smoker  . Smokeless tobacco: Never Used  Substance Use Topics  . Alcohol use: No    Frequency: Never  . Drug use: Not on file    Review of Systems Constitutional: Negative for fever ENT: Mild congestion.  Right ear pain. Cardiovascular: Negative for chest pain. Respiratory: Negative for shortness of breath. Gastrointestinal: States mild pelvic pressure. Genitourinary: Negative for urinary compaints.  Denies any increased discharge or bleeding. Musculoskeletal: Negative for musculoskeletal complaints Neurological: Negative for headache All other ROS negative  ____________________________________________   PHYSICAL EXAM:  VITAL SIGNS: ED Triage Vitals  Enc Vitals Group     BP 10/24/18 2004 (!) 122/56     Pulse Rate 10/24/18 2004 99     Resp 10/24/18 2004 19     Temp 10/24/18 2004 98.3 F (36.8 C)     Temp Source 10/24/18 2004 Oral  SpO2 10/24/18 2004 99 %     Weight 10/24/18 2001 (!) 315 lb (142.9 kg)     Height 10/24/18 2001 5\' 4"  (1.626 m)     Head Circumference --      Peak Flow --      Pain Score 10/24/18 2001 9     Pain Loc --      Pain Edu? --      Excl. in GC? --    Constitutional: Alert and oriented. Well appearing and in no distress. Eyes: Normal exam ENT   Head: Normocephalic and atraumatic.  Patient has mild inflammation of the external auditory canal of the right ear as well as erythema of the tympanic membrane.  The left ear is normal.    Mouth/Throat: Mucous membranes are moist. Cardiovascular: Normal rate, regular rhythm.  Respiratory: Normal respiratory effort without tachypnea nor retractions. Breath sounds are clear  Gastrointestinal: Soft and nontender. No distention.  Musculoskeletal: Nontender with normal range of motion in all extremities. Neurologic:  Normal speech and language. No gross focal neurologic deficits  Skin:  Skin is warm, dry and intact.  Psychiatric: Mood and affect are normal.   ____________________________________________   INITIAL IMPRESSION / ASSESSMENT AND PLAN / ED COURSE  Pertinent labs & imaging results that were available during my care of the patient were reviewed by me and considered in my medical decision making (see chart for details).  Patient presents to the emergency department for right ear pain x1 week.  On examination the patient does have inflammation and tenderness of the external auditory canal as well as erythema of the tympanic membrane itself.  Left ear is normal.  Given these findings we will cover with both a topical as well as oral antibiotic.  Patient states mild pelvic pressure but states this has been present ever since she became pregnant.  Was seen 2 weeks ago for the same had an ultrasound performed with an overall normal work-up.  At that time and today patient has deferred pelvic exam until her first OB appointment.  Patient states she is at no risk for STDs and is not worried about that.  Bedside ultrasound performed by myself shows a normal heart rate of 160 bpm.  Good fetal movement.  ____________________________________________   FINAL CLINICAL IMPRESSION(S) / ED DIAGNOSES  Otitis externa, otitis media    Minna Antis, MD 10/25/18 0001

## 2018-10-24 NOTE — ED Triage Notes (Addendum)
Patient right ear pain X 1 week. Patient reports she has been using OTC earwax removal drops with no relief.  Patient also reports she is [redacted] weeks pregnant. Patient c/o pelvic pain described as cramping.

## 2018-10-25 MED ORDER — OFLOXACIN 0.3 % OP SOLN: OPHTHALMIC | 0 refills | Status: DC

## 2018-10-25 MED ORDER — AMOXICILLIN 500 MG PO CAPS: 500.0000 mg | ORAL_CAPSULE | Freq: Three times a day (TID) | ORAL | 0 refills | Status: AC

## 2018-10-25 MED ORDER — AMOXICILLIN 500 MG PO CAPS
500.0000 mg | ORAL_CAPSULE | Freq: Once | ORAL | Status: AC
  Administered 2018-10-25: 500 mg via ORAL
  Filled 2018-10-25: qty 1

## 2018-10-25 NOTE — ED Notes (Signed)
Patient verbalized understanding of discharge instructions, no questions. Patient ambulated out of ED with steady gait in no distress.  

## 2018-11-29 ENCOUNTER — Emergency Department
Admission: EM | Admit: 2018-11-29 | Discharge: 2018-11-29 | Disposition: A | Discharge status: Home / Self Care | Payer: Medicaid Other | Attending: Emergency Medicine | Admitting: Emergency Medicine

## 2018-11-29 ENCOUNTER — Emergency Department: Payer: Medicaid Other

## 2018-11-29 ENCOUNTER — Other Ambulatory Visit: Payer: Self-pay

## 2018-11-29 ENCOUNTER — Encounter: Payer: Self-pay | Admitting: Emergency Medicine

## 2018-11-29 DIAGNOSIS — O9989 Other specified diseases and conditions complicating pregnancy, childbirth and the puerperium: Secondary | ICD-10-CM | POA: Diagnosis not present

## 2018-11-29 DIAGNOSIS — Z3A16 16 weeks gestation of pregnancy: Secondary | ICD-10-CM | POA: Diagnosis not present

## 2018-11-29 DIAGNOSIS — J069 Acute upper respiratory infection, unspecified: Secondary | ICD-10-CM | POA: Insufficient documentation

## 2018-11-29 LAB — INFLUENZA PANEL BY PCR (TYPE A & B)
Influenza A By PCR: NEGATIVE
Influenza B By PCR: NEGATIVE

## 2018-11-29 LAB — GROUP A STREP BY PCR: Group A Strep by PCR: NOT DETECTED

## 2018-11-29 NOTE — ED Triage Notes (Signed)
First Nurse NOte:  C/O 4 day history of cough, sinus congestion, sore throat and intermittent abdominal pain.  States she is is [redacted] weeks pregnant, EDC:  05/13/2019.  G1 P0.  AAOx3.  Skin warm and dry. NAD

## 2018-11-29 NOTE — ED Notes (Addendum)
See triage note  Presents with 4 day hx of sore throat with subjective fever  Afebrile on arrival  Occasional cough  Pt is 16 weeks preg  Also states she is having some intermittent abd pain w/o vaginal bleeding

## 2018-11-29 NOTE — ED Provider Notes (Signed)
[facility] Emergency Department Provider Note  ____________________________________________  Time seen: Approximately 8:47 PM  I have reviewed the triage vital signs and the nursing notes.   HISTORY  Chief Complaint Sore Throat and Generalized Body Aches    HPI Courtney Forbes is a 23 y.o. female presents to the emergency department with rhinorrhea, congestion, nonproductive cough and pharyngitis for the past 3 to 4 days.  No shortness of breath or chest tightness.  Patient is currently [redacted] weeks pregnant with her first child and has been taking prenatal vitamins.  No vaginal bleeding or abdominal pain.  No gush of vaginal fluid.  Patient has had no diarrhea or emesis.  She is tolerating fluids by mouth.  No recent travel.  No other sick contacts in the home.   History reviewed. No pertinent past medical history.  There are no active problems to display for this patient.   History reviewed. No pertinent surgical history.  Prior to Admission medications   Medication Sig Start Date End Date Taking? Authorizing Provider  albuterol (PROVENTIL HFA;VENTOLIN HFA) 108 (90 Base) MCG/ACT inhaler Inhale 2 puffs into the lungs every 6 (six) hours as needed for wheezing or shortness of breath. 01/25/18   Enid DerryWagner, Ashley, PA-C  azelastine (OPTIVAR) 0.05 % ophthalmic solution Place 1 drop into the left eye 2 (two) times daily. 05/14/18   Cuthriell, Delorise RoyalsJonathan D, PA-C  azithromycin (ZITHROMAX Z-PAK) 250 MG tablet Take 2 tablets (500 mg) on  Day 1,  followed by 1 tablet (250 mg) once daily on Days 2 through 5. 01/25/18   Enid DerryWagner, Ashley, PA-C  dicyclomine (BENTYL) 20 MG tablet Take 1 tablet (20 mg total) by mouth 3 (three) times daily as needed for spasms. 12/05/17 12/05/18  Myrna BlazerSchaevitz, David Matthew, MD  ibuprofen (ADVIL,MOTRIN) 600 MG tablet Take 1 tablet (600 mg total) by mouth every 6 (six) hours as needed for moderate pain. 03/29/17   Evon SlackGaines, Thomas C, PA-C  ofloxacin (OCUFLOX) 0.3 %  ophthalmic solution Apply 5 drops to right ear twice daily x7 days. 10/25/18   Minna AntisPaduchowski, Kevin, MD  ondansetron (ZOFRAN) 4 MG tablet Take 1 tablet (4 mg total) by mouth daily as needed. 12/05/17   Schaevitz, Myra Rudeavid Matthew, MD  predniSONE (DELTASONE) 10 MG tablet Take 6 tablets on day 1, take 5 tablets on day 2, take 4 tablets on day 3, take 3 tablets on day 4, take 2 tablets on day 5, take 1 tablet on day 6 01/25/18   Enid DerryWagner, Ashley, PA-C  trimethoprim-polymyxin b (POLYTRIM) ophthalmic solution Place 2 drops into the left eye every 6 (six) hours. 05/14/18   Cuthriell, Delorise RoyalsJonathan D, PA-C    Allergies Patient has no known allergies.  History reviewed. No pertinent family history.  Social History Social History   Tobacco Use  . Smoking status: Never Smoker  . Smokeless tobacco: Never Used  Substance Use Topics  . Alcohol use: No    Frequency: Never  . Drug use: Not on file      Review of Systems  Constitutional: Patient has low grade fever.  Eyes: No visual changes. No discharge ENT: Patient has congestion.  Cardiovascular: no chest pain. Respiratory: Patient has cough.  Gastrointestinal: No abdominal pain.  No nausea, no vomiting. Patient had diarrhea.  Genitourinary: Negative for dysuria. No hematuria Musculoskeletal: Patient has myalgias.  Skin: Negative for rash, abrasions, lacerations, ecchymosis. Neurological: Patient has headache, no focal weakness or numbness.       ____________________________________________   PHYSICAL EXAM:  VITAL SIGNS: ED Triage Vitals  Enc Vitals Group     BP 11/29/18 1659 (!) 145/78     Pulse Rate 11/29/18 1659 96     Resp 11/29/18 1659 18     Temp 11/29/18 1659 98 F (36.7 C)     Temp Source 11/29/18 1659 Oral     SpO2 11/29/18 1659 99 %     Weight 11/29/18 1701 (!) 340 lb (154.2 kg)     Height 11/29/18 1701 5\' 6"  (1.676 m)     Head Circumference --      Peak Flow --      Pain Score 11/29/18 1657 10     Pain Loc --      Pain  Edu? --      Excl. in GC? --    Constitutional: Alert and oriented. Patient is lying supine. Eyes: Conjunctivae are normal. PERRL. EOMI. Head: Atraumatic. ENT:      Ears: Tympanic membranes are mildly injected with mild effusion bilaterally.       Nose: No congestion/rhinnorhea.      Mouth/Throat: Mucous membranes are moist. Posterior pharynx is mildly erythematous.  Hematological/Lymphatic/Immunilogical: No cervical lymphadenopathy.  Cardiovascular: Normal rate, regular rhythm. Normal S1 and S2.  Good peripheral circulation. Respiratory: Normal respiratory effort without tachypnea or retractions. Lungs CTAB. Good air entry to the bases with no decreased or absent breath sounds. Gastrointestinal: Bowel sounds 4 quadrants. Soft and nontender to palpation. No guarding or rigidity. No palpable masses. No distention. No CVA tenderness. Musculoskeletal: Full range of motion to all extremities. No gross deformities appreciated. Neurologic:  Normal speech and language. No gross focal neurologic deficits are appreciated.  Skin:  Skin is warm, dry and intact. No rash noted. Psychiatric: Mood and affect are normal. Speech and behavior are normal. Patient exhibits appropriate insight and judgement.    ____________________________________________   LABS (all labs ordered are listed, but only abnormal results are displayed)  Labs Reviewed  GROUP A STREP BY PCR  INFLUENZA PANEL BY PCR (TYPE A & B)   ____________________________________________  EKG   ____________________________________________  RADIOLOGY I personally viewed and evaluated these images as part of my medical decision making, as well as reviewing the written report by the radiologist.  Dg Chest 2 View  Result Date: 11/29/2018 CLINICAL DATA:  Initial evaluation for acute cough, sore throat, body aches. EXAM: CHEST - 2 VIEW COMPARISON:  None available. FINDINGS: The cardiac and mediastinal silhouettes are within normal  limits. The lungs are normally inflated. No airspace consolidation, pleural effusion, or pulmonary edema is identified. There is no pneumothorax. No acute osseous abnormality identified. IMPRESSION: No radiographic evidence for active cardiopulmonary disease. Electronically Signed   By: Rise Mu M.D.   On: 11/29/2018 17:53    ____________________________________________    PROCEDURES  Procedure(s) performed:    Procedures    Medications - No data to display   ____________________________________________   INITIAL IMPRESSION / ASSESSMENT AND PLAN / ED COURSE  Pertinent labs & imaging results that were available during my care of the patient were reviewed by me and considered in my medical decision making (see chart for details).  Review of the [place] CSRS was performed in accordance of the [facility] prior to dispensing any controlled drugs.      Assessment and Plan:  Unspecified viral URI Patient presents to the emergency department with rhinorrhea, congestion, nonproductive cough and low-grade fever for the past 3 to 4 days.  Chest x-ray reveals no consolidations, opacities or infiltrates  that would suggest community-acquired pneumonia.  Influenza testing was negative in the emergency department.  Unspecified viral URI is likely at this time.  Tylenol was recommended for pharyngitis.  Rest and hydration were encouraged.  Patient was advised to follow-up with primary care as needed.    ____________________________________________  FINAL CLINICAL IMPRESSION(S) / ED DIAGNOSES  Final diagnoses:  Viral upper respiratory tract infection      NEW MEDICATIONS STARTED DURING THIS VISIT:  ED Discharge Orders    None          This chart was dictated using voice recognition software/Dragon. Despite best efforts to proofread, errors can occur which can change the meaning. Any change was purely unintentional.    Gasper LloydWoods, Jaclyn M, PA-C 11/29/18 2054    Phineas SemenGoodman,  Graydon, MD 11/29/18 2104

## 2018-11-29 NOTE — ED Triage Notes (Signed)
Pt arrived via POV with reports of sore throat, generalized body aches, dry non-productive cough x 4 days.    Pt states she is [redacted] weeks pregnant, coughing causes pain in stomach.

## 2019-03-30 ENCOUNTER — Observation Stay
Admission: EM | Admit: 2019-03-30 | Discharge: 2019-03-30 | Disposition: A | Discharge status: Home / Self Care | Payer: Medicaid Other | Attending: Obstetrics and Gynecology | Admitting: Obstetrics and Gynecology

## 2019-03-30 ENCOUNTER — Other Ambulatory Visit: Payer: Self-pay

## 2019-03-30 DIAGNOSIS — Z7951 Long term (current) use of inhaled steroids: Secondary | ICD-10-CM | POA: Insufficient documentation

## 2019-03-30 DIAGNOSIS — Z791 Long term (current) use of non-steroidal anti-inflammatories (NSAID): Secondary | ICD-10-CM | POA: Insufficient documentation

## 2019-03-30 DIAGNOSIS — Z3A33 33 weeks gestation of pregnancy: Secondary | ICD-10-CM | POA: Diagnosis not present

## 2019-03-30 DIAGNOSIS — R109 Unspecified abdominal pain: Secondary | ICD-10-CM

## 2019-03-30 DIAGNOSIS — O368130 Decreased fetal movements, third trimester, not applicable or unspecified: Secondary | ICD-10-CM | POA: Diagnosis not present

## 2019-03-30 DIAGNOSIS — O26893 Other specified pregnancy related conditions, third trimester: Secondary | ICD-10-CM

## 2019-03-30 HISTORY — DX: Other specified health status: Z78.9

## 2019-03-30 LAB — URINALYSIS, ROUTINE W REFLEX MICROSCOPIC
Bilirubin Urine: NEGATIVE
Glucose, UA: NEGATIVE mg/dL
Hgb urine dipstick: NEGATIVE
Ketones, ur: NEGATIVE mg/dL
Leukocytes,Ua: NEGATIVE
Nitrite: NEGATIVE
Protein, ur: 30 mg/dL — AB
Specific Gravity, Urine: 1.029 (ref 1.005–1.030)
pH: 5 (ref 5.0–8.0)

## 2019-03-30 MED ORDER — LIDOCAINE HCL (PF) 1 % IJ SOLN: 30.0000 mL | INTRAMUSCULAR | Status: DC | PRN

## 2019-03-30 MED ORDER — LACTATED RINGERS IV SOLN: 500.0000 mL | INTRAVENOUS | Status: DC | PRN

## 2019-03-30 MED ORDER — LACTATED RINGERS IV SOLN: INTRAVENOUS | Status: DC

## 2019-03-30 MED ORDER — OXYTOCIN BOLUS FROM INFUSION: 500.0000 mL | Freq: Once | INTRAVENOUS | Status: DC

## 2019-03-30 MED ORDER — ACETAMINOPHEN 325 MG PO TABS: 650.0000 mg | ORAL_TABLET | ORAL | Status: DC | PRN

## 2019-03-30 MED ORDER — OXYTOCIN 40 UNITS IN NORMAL SALINE INFUSION - SIMPLE MED: 2.5000 [IU]/h | INTRAVENOUS | Status: DC

## 2019-03-30 NOTE — Discharge Summary (Signed)
    L&D OB Triage Note  SUBJECTIVE Courtney Forbes is a 23 y.o. G1P0 female at [redacted]w[redacted]d, EDD Estimated Date of Delivery: 05/13/19 who presented to triage with complaints of decreased fetal movement occasional abdominal pain.   OB History  Gravida Para Term Preterm AB Living  1 0 0 0 0 0  SAB TAB Ectopic Multiple Live Births  0 0 0 0 0    # Outcome Date GA Lbr Len/2nd Weight Sex Delivery Anes PTL Lv  1 Current             Medications Prior to Admission  Medication Sig Dispense Refill Last Dose  . albuterol (PROVENTIL HFA;VENTOLIN HFA) 108 (90 Base) MCG/ACT inhaler Inhale 2 puffs into the lungs every 6 (six) hours as needed for wheezing or shortness of breath. 1 Inhaler 0   . azelastine (OPTIVAR) 0.05 % ophthalmic solution Place 1 drop into the left eye 2 (two) times daily. 6 mL 1   . azithromycin (ZITHROMAX Z-PAK) 250 MG tablet Take 2 tablets (500 mg) on  Day 1,  followed by 1 tablet (250 mg) once daily on Days 2 through 5. 6 each 0   . dicyclomine (BENTYL) 20 MG tablet Take 1 tablet (20 mg total) by mouth 3 (three) times daily as needed for spasms. 30 tablet 0   . ibuprofen (ADVIL,MOTRIN) 600 MG tablet Take 1 tablet (600 mg total) by mouth every 6 (six) hours as needed for moderate pain. 30 tablet 0   . ofloxacin (OCUFLOX) 0.3 % ophthalmic solution Apply 5 drops to right ear twice daily x7 days. 10 mL 0   . ondansetron (ZOFRAN) 4 MG tablet Take 1 tablet (4 mg total) by mouth daily as needed. 10 tablet 0   . predniSONE (DELTASONE) 10 MG tablet Take 6 tablets on day 1, take 5 tablets on day 2, take 4 tablets on day 3, take 3 tablets on day 4, take 2 tablets on day 5, take 1 tablet on day 6 21 tablet 0   . trimethoprim-polymyxin b (POLYTRIM) ophthalmic solution Place 2 drops into the left eye every 6 (six) hours. 10 mL 0      OBJECTIVE  Nursing Evaluation:   BP 107/81   Pulse (!) 105   Temp 98.3 F (36.8 C) (Oral)   Resp 18   LMP  (LMP Unknown)    Findings:   Urinalysis negative, no  contractions, no bleeding or leakage of fluid noted.  Patient not in labor.  Reassuring NST.  NST was performed and has been reviewed by me.  NST INTERPRETATION: Category I                   ASSESSMENT Impression:  1.  Pregnancy:  G1P0 at [redacted]w[redacted]d , EDD Estimated Date of Delivery: 05/13/19 2.  NST:  Category I  PLAN 1. Reassurance given 2. Discharge home with standard labor precautions given to return to L&D or call the office for problems. 3. Continue routine prenatal care.

## 2019-03-30 NOTE — OB Triage Note (Signed)
Pt presents to unit c/o lower abdominal pain that started around 1830 this afternoon, rating pain 7/10. Pt states that she has n ot felt baby move in the last 2 hours. Pt denies vaginal bleeding or LOF. Pt does state she had sex in last 24 hours. Initial FHT 147. Vital signs WNL. Will continue to monitor.

## 2019-04-17 ENCOUNTER — Observation Stay
Admission: EM | Admit: 2019-04-17 | Discharge: 2019-04-17 | Disposition: A | Discharge status: Home / Self Care | Payer: Medicaid Other | Attending: Obstetrics and Gynecology | Admitting: Obstetrics and Gynecology

## 2019-04-17 ENCOUNTER — Other Ambulatory Visit: Payer: Self-pay

## 2019-04-17 DIAGNOSIS — R109 Unspecified abdominal pain: Secondary | ICD-10-CM | POA: Diagnosis present

## 2019-04-17 DIAGNOSIS — Z3A37 37 weeks gestation of pregnancy: Secondary | ICD-10-CM | POA: Insufficient documentation

## 2019-04-17 DIAGNOSIS — O26893 Other specified pregnancy related conditions, third trimester: Principal | ICD-10-CM | POA: Insufficient documentation

## 2019-04-17 NOTE — OB Triage Note (Addendum)
PT is a 22y/o G1P0 at [redacted]w[redacted]d w/ c/o decreased FM and left sided abdominal pain. Pt states pain has gotten worse over the past several weeks and states she believes it to be baby growing.Pt denies vag bleeding, gushes of fluid and ctx. Initial FHT 150. Monitors applied and assessing. V/s WNL

## 2019-04-22 NOTE — Discharge Summary (Signed)
    L&D OB Triage Note  SUBJECTIVE Courtney Forbes is a 23 y.o. G1P0 female at [redacted]w[redacted]d, EDD Estimated Date of Delivery: 05/13/19 who presented to triage with complaints of random abdominal pain.  Patient says she thinks the baby is growing.  Denies rupture of membranes or vaginal bleeding.  OB History  Gravida Para Term Preterm AB Living  1 0 0 0 0 0  SAB TAB Ectopic Multiple Live Births  0 0 0 0 0    # Outcome Date GA Lbr Len/2nd Weight Sex Delivery Anes PTL Lv  1 Current             No medications prior to admission.     OBJECTIVE  Nursing Evaluation:   BP 125/69 (BP Location: Right Arm)   Pulse 100   Temp 98.2 F (36.8 C) (Oral)   Resp 18   Ht 5\' 5"  (1.651 m)   Wt (!) 153.3 kg   LMP  (LMP Unknown)   BMI 56.25 kg/m    Findings:   Not in labor.  Reassuring FHTs  NST was performed and has been reviewed by me.  NST INTERPRETATION: Category I  Mode: External Baseline Rate (A): 135 bpm Variability: Moderate Accelerations: 15 x 15 Decelerations: None     Contraction Frequency (min): none; UI  ASSESSMENT Impression:  1.  Pregnancy:  G1P0 at [redacted]w[redacted]d , EDD Estimated Date of Delivery: 05/13/19 2.  NST:  Category I  PLAN 1. Reassurance given 2. Discharge home with standard labor precautions given to return to L&D or call the office for problems. 3. Continue routine prenatal care.

## 2019-05-21 ENCOUNTER — Telehealth: Payer: Self-pay | Admitting: Licensed Clinical Social Worker

## 2019-05-21 NOTE — Telephone Encounter (Signed)
Attempted phone call to patient for two week mood check. VM was left for patient to return call.

## 2019-05-26 ENCOUNTER — Ambulatory Visit: Payer: Medicaid Other | Admitting: Licensed Clinical Social Worker

## 2019-05-26 DIAGNOSIS — F53 Postpartum depression: Secondary | ICD-10-CM

## 2019-05-26 DIAGNOSIS — F321 Major depressive disorder, single episode, moderate: Secondary | ICD-10-CM

## 2019-05-26 NOTE — Progress Notes (Signed)
Counselor/Therapist Progress Note  Patient ID: Courtney Forbes, MRN: [id number],    Date: 05/26/2019  Time Spent: 15 minutes    Treatment Type: 2 week post partum mood check   Reported Symptoms: Depressed and frustrated / overwhelmed   Mental Status Exam:  Appearance:   NA     Behavior:  Appropriate  Motor:  Normal  Speech/Language:   Normal Rate  Affect:  NA  Mood:  anxious  Thought process:  normal  Thought content:    WNL  Sensory/Perceptual disturbances:    WNL  Orientation:  oriented to person, place and time/date  Attention:  Good  Concentration:  Fair  Memory:  WNL  Fund of knowledge:   Good  Insight:    Good  Judgment:   Good  Impulse Control:  Good   Risk Assessment: Danger to Self:  No Self-injurious Behavior: No Danger to Others: No Duty to Warn:no Physical Aggression / Violence:No  Access to Firearms a concern: No  Gang Involvement:No   Subjective: Patient was engaged and cooperative throughout the brief phone-session. EPDS = 25 and reports significant symptoms of depressed mood, anxiety, and sleep disturbance - even when she has opportunity to sleep, she has difficulties sleeping. She denies any history of mental health diagnosis or treatment.  Although patient voiced passive suicidal thoughts, she denies any plan or intent and reports she seeks support from her mom, which helps. Patient agreed to continue treatment and scheduled an assessment appointment. She also voiced openness to medication management.   Interventions: LCSW established psychological safety with patient. Conducted two week postpartum mood check. Assessed patient's symptoms, discussed postpartum mood and anxiety issues and discussed treatment options. Encouraged patient to continue to seek support from her mother and from father of the baby. Provided support through active listening, validation of feelings, and highlighted patient's strengths.    Diagnosis: Continue to assess.   Plan:  Patient to follow up for care (assessment) with LCSW on 06/05/2019. Patient also open to medication management. LCSW will follow up with Dr. Ernestina Patches regarding patient's request for meds. Patient's pharmacy is [facility] on [address].   Milton Ferguson, LCSW

## 2019-05-26 NOTE — Telephone Encounter (Signed)
Scheduled mood check appointment- conducted appt. Vis phone and schedule follow up.

## 2019-05-26 NOTE — Telephone Encounter (Signed)
LCSW spoke with patient- patient was driving and asked for LCSW to call later today - 4pm.

## 2019-05-27 ENCOUNTER — Telehealth: Payer: Self-pay | Admitting: Licensed Clinical Social Worker

## 2019-05-27 DIAGNOSIS — F53 Postpartum depression: Secondary | ICD-10-CM | POA: Insufficient documentation

## 2019-05-27 MED ORDER — SERTRALINE HCL 25 MG PO TABS: 25.0000 mg | ORAL_TABLET | Freq: Every day | ORAL | 0 refills | Status: AC

## 2019-05-27 NOTE — Addendum Note (Signed)
Addended by: Caryl Bis on: 05/27/2019 08:46 AM   Modules accepted: Orders

## 2019-05-27 NOTE — Telephone Encounter (Signed)
LCSW called and spoke with patient - notifying her of Zoloft prescription and message in [place]. Patient voiced appreciation.

## 2019-05-27 NOTE — Progress Notes (Addendum)
I discussed this case with the LCSW and agree with starting a medication. I have send in Zoloft to the pharmacy on file and plan to have the patient see a provider when she is on-site seeing our Winfred Burn on 7/16. Patient will start on Zoloft and then increase to 50mg  after 1 week.  I have sent the patient a MyChart message as well with instructions.   Future Appointments  Date Time Provider [place]  06/05/2019  3:30 PM Milton Ferguson, LCSW [facility] None

## 2019-06-05 ENCOUNTER — Ambulatory Visit: Payer: Self-pay | Admitting: Licensed Clinical Social Worker

## 2019-06-05 ENCOUNTER — Telehealth: Payer: Self-pay | Admitting: Licensed Clinical Social Worker

## 2019-06-05 NOTE — Telephone Encounter (Signed)
LCSW called patient to follow up on missed appointment. Patient reported that she had a lot going on and forgot about the appointment. Patient was willing to reschedule, however LCSW informed patient that there was not in-office appointments until August and the August schedule is not out. LCSW encouraged patient to call back in one week.

## 2019-06-11 NOTE — Telephone Encounter (Signed)
LCSW called to follow up with patient regarding scheduling an appointment. Patient answered and asked if she could please call LCSW back.   No further follow up will be made at this time.

## 2019-08-05 ENCOUNTER — Encounter (HOSPITAL_COMMUNITY): Payer: Self-pay

## 2019-08-26 ENCOUNTER — Encounter: Payer: Self-pay | Admitting: Advanced Practice Midwife

## 2019-08-26 DIAGNOSIS — O139 Gestational [pregnancy-induced] hypertension without significant proteinuria, unspecified trimester: Secondary | ICD-10-CM | POA: Insufficient documentation

## 2019-08-26 DIAGNOSIS — F17200 Nicotine dependence, unspecified, uncomplicated: Secondary | ICD-10-CM | POA: Insufficient documentation

## 2019-08-26 DIAGNOSIS — F4310 Post-traumatic stress disorder, unspecified: Secondary | ICD-10-CM | POA: Insufficient documentation

## 2019-08-26 DIAGNOSIS — Z98891 History of uterine scar from previous surgery: Secondary | ICD-10-CM | POA: Insufficient documentation

## 2019-10-24 ENCOUNTER — Other Ambulatory Visit: Payer: Self-pay

## 2019-10-24 DIAGNOSIS — Z20822 Contact with and (suspected) exposure to covid-19: Secondary | ICD-10-CM

## 2019-10-26 LAB — NOVEL CORONAVIRUS, NAA: SARS-CoV-2, NAA: NOT DETECTED

## 2020-03-08 ENCOUNTER — Other Ambulatory Visit: Payer: Self-pay

## 2020-03-08 ENCOUNTER — Ambulatory Visit: Payer: Medicaid Other | Attending: Internal Medicine

## 2020-03-08 DIAGNOSIS — Z20822 Contact with and (suspected) exposure to covid-19: Secondary | ICD-10-CM

## 2020-03-09 ENCOUNTER — Telehealth: Payer: Self-pay | Admitting: *Deleted

## 2020-03-09 LAB — SARS-COV-2, NAA 2 DAY TAT

## 2020-03-09 LAB — NOVEL CORONAVIRUS, NAA: SARS-CoV-2, NAA: NOT DETECTED

## 2020-03-09 NOTE — Telephone Encounter (Signed)
Patient called for covid test results ,still pending.

## 2020-12-02 ENCOUNTER — Other Ambulatory Visit: Payer: Medicaid Other

## 2021-04-05 ENCOUNTER — Other Ambulatory Visit: Payer: Medicaid Other

## 2021-04-22 ENCOUNTER — Other Ambulatory Visit: Payer: Self-pay

## 2021-04-22 ENCOUNTER — Ambulatory Visit (LOCAL_COMMUNITY_HEALTH_CENTER): Payer: Self-pay

## 2021-04-22 DIAGNOSIS — Z111 Encounter for screening for respiratory tuberculosis: Secondary | ICD-10-CM

## 2021-04-25 ENCOUNTER — Ambulatory Visit (LOCAL_COMMUNITY_HEALTH_CENTER): Payer: Medicaid Other

## 2021-04-25 ENCOUNTER — Other Ambulatory Visit: Payer: Self-pay

## 2021-04-25 DIAGNOSIS — Z111 Encounter for screening for respiratory tuberculosis: Secondary | ICD-10-CM

## 2021-04-25 LAB — TB SKIN TEST
Induration: 0 mm
TB Skin Test: NEGATIVE

## 2021-12-30 ENCOUNTER — Ambulatory Visit: Payer: Medicaid Other | Admitting: Dietician

## 2022-01-26 ENCOUNTER — Encounter: Payer: Self-pay | Admitting: Dietician

## 2022-01-26 NOTE — Progress Notes (Signed)
Have not heard back from patient after her missed appointment on 12/30/21. Sent notification to referring provider. ?

## 2022-04-24 ENCOUNTER — Emergency Department: Payer: Medicaid Other

## 2022-04-24 ENCOUNTER — Other Ambulatory Visit: Payer: Self-pay

## 2022-04-24 ENCOUNTER — Emergency Department
Admission: EM | Admit: 2022-04-24 | Discharge: 2022-04-24 | Disposition: A | Discharge status: Home / Self Care | Payer: Medicaid Other | Attending: Emergency Medicine | Admitting: Emergency Medicine

## 2022-04-24 DIAGNOSIS — S8001XA Contusion of right knee, initial encounter: Secondary | ICD-10-CM | POA: Diagnosis not present

## 2022-04-24 DIAGNOSIS — R6884 Jaw pain: Secondary | ICD-10-CM | POA: Diagnosis not present

## 2022-04-24 DIAGNOSIS — M545 Low back pain, unspecified: Secondary | ICD-10-CM | POA: Diagnosis not present

## 2022-04-24 DIAGNOSIS — S8990XA Unspecified injury of unspecified lower leg, initial encounter: Secondary | ICD-10-CM | POA: Diagnosis present

## 2022-04-24 MED ORDER — IBUPROFEN 600 MG PO TABS: 600.0000 mg | ORAL_TABLET | Freq: Four times a day (QID) | ORAL | 0 refills | Status: AC | PRN

## 2022-04-24 MED ORDER — LIDOCAINE 5 % EX PTCH
1.0000 | MEDICATED_PATCH | CUTANEOUS | Status: DC
  Administered 2022-04-24: 1 via TRANSDERMAL

## 2022-04-24 MED ORDER — LIDOCAINE 5 % EX PTCH: 1.0000 | MEDICATED_PATCH | Freq: Two times a day (BID) | CUTANEOUS | 0 refills | Status: AC

## 2022-04-24 MED ORDER — ACETAMINOPHEN 500 MG PO TABS
1000.0000 mg | ORAL_TABLET | Freq: Once | ORAL | Status: AC
  Administered 2022-04-24: 1000 mg via ORAL

## 2022-04-24 MED ORDER — KETOROLAC TROMETHAMINE 30 MG/ML IJ SOLN
30.0000 mg | Freq: Once | INTRAMUSCULAR | Status: AC
  Administered 2022-04-24: 30 mg via INTRAMUSCULAR

## 2022-04-24 NOTE — Discharge Instructions (Addendum)
Take the ibuprofen with Tylenol 1 g every 8 hours and you can use the pain patches to help with pain and return to the ER if you develop worsening symptoms or any other concerns.  If your knee is not feeling better and a week he can follow-up with orthopedics
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
